# Patient Record
Sex: Female | Born: 1970
Health system: Southern US, Community
[De-identification: ages and names within clinical notes are randomized; demographics above are authoritative.]

## PROBLEM LIST (undated history)

## (undated) DIAGNOSIS — F32A Depression, unspecified: Secondary | ICD-10-CM

## (undated) DIAGNOSIS — E559 Vitamin D deficiency, unspecified: Secondary | ICD-10-CM

## (undated) DIAGNOSIS — F329 Major depressive disorder, single episode, unspecified: Secondary | ICD-10-CM

## (undated) DIAGNOSIS — E669 Obesity, unspecified: Secondary | ICD-10-CM

## (undated) DIAGNOSIS — F419 Anxiety disorder, unspecified: Secondary | ICD-10-CM

## (undated) HISTORY — DX: Obesity, unspecified: E66.9

## (undated) HISTORY — DX: Anxiety disorder, unspecified: F41.9

## (undated) HISTORY — DX: Vitamin D deficiency, unspecified: E55.9

## (undated) HISTORY — DX: Depression, unspecified: F32.A

## (undated) HISTORY — DX: Major depressive disorder, single episode, unspecified: F32.9

---

## 1998-03-16 ENCOUNTER — Other Ambulatory Visit: Admission: RE | Admit: 1998-03-16 | Discharge: 1998-03-16 | Payer: Self-pay | Admitting: Obstetrics and Gynecology

## 1999-03-24 ENCOUNTER — Other Ambulatory Visit: Admission: RE | Admit: 1999-03-24 | Discharge: 1999-03-24 | Payer: Self-pay | Admitting: Obstetrics and Gynecology

## 2000-06-12 ENCOUNTER — Other Ambulatory Visit: Admission: RE | Admit: 2000-06-12 | Discharge: 2000-06-12 | Payer: Self-pay | Admitting: Obstetrics and Gynecology

## 2001-03-19 ENCOUNTER — Inpatient Hospital Stay (HOSPITAL_COMMUNITY): Admission: AD | Admit: 2001-03-19 | Discharge: 2001-03-23 | Payer: Self-pay | Admitting: Obstetrics and Gynecology

## 2001-05-01 ENCOUNTER — Other Ambulatory Visit: Admission: RE | Admit: 2001-05-01 | Discharge: 2001-05-01 | Payer: Self-pay | Admitting: Obstetrics and Gynecology

## 2002-06-03 ENCOUNTER — Other Ambulatory Visit: Admission: RE | Admit: 2002-06-03 | Discharge: 2002-06-03 | Payer: Self-pay | Admitting: Obstetrics and Gynecology

## 2003-07-09 ENCOUNTER — Other Ambulatory Visit: Admission: RE | Admit: 2003-07-09 | Discharge: 2003-07-09 | Payer: Self-pay | Admitting: Obstetrics and Gynecology

## 2003-09-04 ENCOUNTER — Ambulatory Visit (HOSPITAL_COMMUNITY): Admission: RE | Admit: 2003-09-04 | Discharge: 2003-09-04 | Payer: Self-pay | Admitting: Obstetrics and Gynecology

## 2003-10-18 HISTORY — PX: HYSTEROSCOPY: SHX211

## 2004-03-31 ENCOUNTER — Other Ambulatory Visit: Admission: RE | Admit: 2004-03-31 | Discharge: 2004-03-31 | Payer: Self-pay | Admitting: Obstetrics and Gynecology

## 2004-09-07 ENCOUNTER — Inpatient Hospital Stay (HOSPITAL_COMMUNITY): Admission: AD | Admit: 2004-09-07 | Discharge: 2004-09-07 | Payer: Self-pay | Admitting: Obstetrics and Gynecology

## 2004-11-09 ENCOUNTER — Other Ambulatory Visit: Admission: RE | Admit: 2004-11-09 | Discharge: 2004-11-09 | Payer: Self-pay | Admitting: Obstetrics and Gynecology

## 2005-12-08 ENCOUNTER — Other Ambulatory Visit: Admission: RE | Admit: 2005-12-08 | Discharge: 2005-12-08 | Payer: Self-pay | Admitting: Obstetrics and Gynecology

## 2012-04-18 ENCOUNTER — Other Ambulatory Visit: Payer: Self-pay | Admitting: Obstetrics and Gynecology

## 2013-09-10 ENCOUNTER — Other Ambulatory Visit: Payer: Self-pay | Admitting: Obstetrics and Gynecology

## 2014-06-07 ENCOUNTER — Ambulatory Visit: Payer: Self-pay | Admitting: Physician Assistant

## 2014-06-07 ENCOUNTER — Ambulatory Visit: Payer: Self-pay | Admitting: Family Medicine

## 2014-06-07 LAB — COMPREHENSIVE METABOLIC PANEL
ALBUMIN: 3.8 g/dL (ref 3.4–5.0)
ANION GAP: 7 (ref 7–16)
Alkaline Phosphatase: 98 U/L
BILIRUBIN TOTAL: 1 mg/dL (ref 0.2–1.0)
BUN: 6 mg/dL — AB (ref 7–18)
CREATININE: 0.72 mg/dL (ref 0.60–1.30)
Calcium, Total: 8.9 mg/dL (ref 8.5–10.1)
Chloride: 103 mmol/L (ref 98–107)
Co2: 27 mmol/L (ref 21–32)
EGFR (Non-African Amer.): >60
GLUCOSE: 104 mg/dL — AB (ref 65–99)
OSMOLALITY: 272 (ref 275–301)
POTASSIUM: 3.8 mmol/L (ref 3.5–5.1)
SGOT(AST): 15 U/L (ref 15–37)
SGPT (ALT): 39 U/L
SODIUM: 137 mmol/L (ref 136–145)
TOTAL PROTEIN: 7.7 g/dL (ref 6.4–8.2)

## 2014-06-07 LAB — CBC WITH DIFFERENTIAL/PLATELET
BASOS PCT: 0.6 %
Basophil #: 0 10*3/uL (ref 0.0–0.1)
EOS ABS: 0 10*3/uL (ref 0.0–0.7)
Eosinophil %: 0.2 %
HCT: 43.4 % (ref 35.0–47.0)
HGB: 14.5 g/dL (ref 12.0–16.0)
LYMPHS ABS: 1.2 10*3/uL (ref 1.0–3.6)
LYMPHS PCT: 17.3 %
MCH: 30.3 pg (ref 26.0–34.0)
MCHC: 33.5 g/dL (ref 32.0–36.0)
MCV: 91 fL (ref 80–100)
Monocyte #: 0.6 x10 3/mm (ref 0.2–0.9)
Monocyte %: 9.1 %
NEUTROS PCT: 72.8 %
Neutrophil #: 5.1 10*3/uL (ref 1.4–6.5)
PLATELETS: 246 10*3/uL (ref 150–440)
RBC: 4.79 10*6/uL (ref 3.80–5.20)
RDW: 13.6 % (ref 11.5–14.5)
WBC: 7 10*3/uL (ref 3.6–11.0)

## 2014-06-07 LAB — URINALYSIS, COMPLETE
BACTERIA: NEGATIVE
BILIRUBIN, UR: NEGATIVE
Blood: NEGATIVE
Glucose,UR: NEGATIVE
LEUKOCYTE ESTERASE: NEGATIVE
Nitrite: NEGATIVE
Ph: 6 (ref 5.0–8.0)
Protein: NEGATIVE
Specific Gravity: 1.02 (ref 1.000–1.030)
WBC UR: NONE SEEN /HPF (ref 0–5)

## 2014-06-07 LAB — PREGNANCY, URINE: Pregnancy Test, Urine: NEGATIVE m[IU]/mL

## 2014-06-07 LAB — LIPASE, BLOOD: Lipase: 139 U/L (ref 73–393)

## 2014-06-07 LAB — AMYLASE: Amylase: 44 U/L (ref 25–115)

## 2015-04-02 ENCOUNTER — Encounter: Payer: Self-pay | Admitting: *Deleted

## 2015-04-06 ENCOUNTER — Ambulatory Visit (INDEPENDENT_AMBULATORY_CARE_PROVIDER_SITE_OTHER): Payer: No Typology Code available for payment source | Admitting: *Deleted

## 2015-04-06 VITALS — BP 139/92 | HR 75 | Ht 65.0 in | Wt 213.0 lb

## 2015-04-06 DIAGNOSIS — E669 Obesity, unspecified: Secondary | ICD-10-CM

## 2015-04-06 MED ORDER — CYANOCOBALAMIN 1000 MCG/ML IJ SOLN
1000.0000 ug | Freq: Once | INTRAMUSCULAR | Status: AC
  Administered 2015-04-06: 1000 ug via INTRAMUSCULAR

## 2015-05-04 ENCOUNTER — Ambulatory Visit (INDEPENDENT_AMBULATORY_CARE_PROVIDER_SITE_OTHER): Payer: No Typology Code available for payment source | Admitting: Obstetrics and Gynecology

## 2015-05-04 VITALS — BP 135/90 | HR 84 | Ht 65.0 in | Wt 211.7 lb

## 2015-05-04 DIAGNOSIS — E669 Obesity, unspecified: Secondary | ICD-10-CM | POA: Diagnosis not present

## 2015-05-04 MED ORDER — CYANOCOBALAMIN 1000 MCG/ML IJ SOLN
1000.0000 ug | Freq: Once | INTRAMUSCULAR | Status: AC
  Administered 2015-05-04: 1000 ug via INTRAMUSCULAR

## 2015-05-04 NOTE — Progress Notes (Cosign Needed)
Pt is here for wt, bp check, b-12 inj Denies any s/e, feels like she is losing inches, is going to try to start exercising

## 2015-05-06 ENCOUNTER — Other Ambulatory Visit: Payer: Self-pay | Admitting: Obstetrics and Gynecology

## 2015-07-06 ENCOUNTER — Other Ambulatory Visit: Payer: Self-pay | Admitting: Obstetrics and Gynecology

## 2015-07-16 ENCOUNTER — Encounter (INDEPENDENT_AMBULATORY_CARE_PROVIDER_SITE_OTHER): Payer: Self-pay

## 2015-07-16 ENCOUNTER — Encounter: Payer: Self-pay | Admitting: Primary Care

## 2015-07-16 ENCOUNTER — Ambulatory Visit (INDEPENDENT_AMBULATORY_CARE_PROVIDER_SITE_OTHER): Payer: No Typology Code available for payment source | Admitting: Primary Care

## 2015-07-16 VITALS — BP 126/72 | HR 80 | Temp 97.6°F | Ht 65.0 in | Wt 218.8 lb

## 2015-07-16 DIAGNOSIS — E669 Obesity, unspecified: Secondary | ICD-10-CM

## 2015-07-16 DIAGNOSIS — F411 Generalized anxiety disorder: Secondary | ICD-10-CM | POA: Diagnosis not present

## 2015-07-16 DIAGNOSIS — E559 Vitamin D deficiency, unspecified: Secondary | ICD-10-CM | POA: Insufficient documentation

## 2015-07-16 DIAGNOSIS — R21 Rash and other nonspecific skin eruption: Secondary | ICD-10-CM

## 2015-07-16 LAB — VITAMIN D 25 HYDROXY (VIT D DEFICIENCY, FRACTURES): VITD: 54.56 ng/mL (ref 30.00–100.00)

## 2015-07-16 MED ORDER — ESCITALOPRAM OXALATE 20 MG PO TABS: 20.0000 mg | ORAL_TABLET | Freq: Every day | ORAL | Status: DC

## 2015-07-16 MED ORDER — TRIAMCINOLONE ACETONIDE 0.1 % EX CREA: 1.0000 "application " | TOPICAL_CREAM | Freq: Two times a day (BID) | CUTANEOUS | Status: DC

## 2015-07-16 NOTE — Patient Instructions (Addendum)
Start Lexapro 20 mg tablets for anxiety. You may double up on the 10 mg tablets until complete. I sent the 20 mg to your pharmacy.  Complete lab work prior to leaving today. I will notify you of your results.  Stop by the front and speak with Shirlee Limerick regarding your referral to the nutritionist.  Apply triamcinolone cream to rash twice daily. Do not apply to face or groin.  Follow up in 4-6 weeks for re-evaluation of anxiety.  It was a pleasure to meet you today! Please don't hesitate to call me with any questions. Welcome to [facility]!

## 2015-07-16 NOTE — Assessment & Plan Note (Signed)
Present to upper extremities. Evaluated by dermatology and is unsure of the cause. Will send RX for triamcinolone cream to use BID. If continues, will send back to derm.

## 2015-07-16 NOTE — Progress Notes (Signed)
Subjective:    Patient ID: Kayla Hanna, female    DOB: August 15, 1971, 44 y.o.   MRN: [id number]  HPI  [name] is a 44 year old female who presents today to establish care and discuss the problems mentioned below. Will obtain old records.  1) Generalized Anxiety Disorder: Diagnosed in August 2015. She had lost 20 pounds from not eating for several weeks at a time due to anxiety. She was placed on Lexapro 20 mg initially. She is currently managed on Lexapro 10 mg. She was reduced to 10 mg due concerns of weight gain in May 2016. Since the reduction of the medication she was feeling fine for several months but has been under a lot of stress with family issues and no longer feels well managed. She felt better on the 20 mg dose.   2) Skin Bumps: Present since July and located to bilateral upper extremities. She was evaluated by a dermatologist who prescribed a "special moose". She doesn't feel as though it's working well. Her sores will start out as a blister then progress to itching, and scabbing.  3) Vitamin D deficiency: Currently managed on vitamin D 50,000 unit capsules since May 2016. She has not had follow up of levels since.    4) Obesity: Once managed on phentermine 37.5 mg tablets starting in May 2016. She lost 12 pounds. Last dose was in July. She has been maintaining her weight loss, but is frustrated with her lack of weight loss despite a "healthy diet".  Her diet currently consists of: Breakfast: Oatmeal or protein bar Lunch: Salad, grilled chicken, sandwich, take out. Dinner: Eating out (greek food, moes, fast food). Home prepared meals with steak, chicken pie, roast, grilled chicken.  Snacks: None Beverages: Coffee, sweet tea with splenda or truvia and sugar. She does not drink much water.  Exercise: She is currently not exercising as she does not have time in her schedule.   Review of Systems  HENT: Negative for rhinorrhea.   Respiratory: Negative for cough and  shortness of breath.   Cardiovascular: Negative for chest pain.  Gastrointestinal: Negative for diarrhea and constipation.  Genitourinary: Negative for difficulty urinating.       No periods with IUD  Musculoskeletal: Negative for myalgias and arthralgias.  Skin: Positive for rash.  Neurological: Negative for dizziness, numbness and headaches.  Psychiatric/Behavioral:       See HPI.       Past Medical History  Diagnosis Date  . Obesity   . Anxiety   . Depression   . Vitamin D deficiency     Social History   Social History  . Marital Status: Married    Spouse Name: N/A  . Number of Children: N/A  . Years of Education: N/A   Occupational History  . Not on file.   Social History Main Topics  . Smoking status: Never Smoker   . Smokeless tobacco: Never Used  . Alcohol Use: Yes     Comment: occas  . Drug Use: No  . Sexual Activity: Yes    Birth Control/ Protection: IUD   Other Topics Concern  . Not on file   Social History Narrative   Married.   2 children.   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   Enjoys shopping, reading, exercising.     Past Surgical History  Procedure Laterality Date  . Hysteroscopy  2005    Family History  Problem Relation Age of Onset  . Heart disease Paternal Grandmother   .  Diabetes Paternal Grandmother     No Known Allergies  Current Outpatient Prescriptions on File Prior to Visit  Medication Sig Dispense Refill  . levonorgestrel (MIRENA) 20 MCG/24HR IUD 1 each by Intrauterine route once.    . phentermine (ADIPEX-P) 37.5 MG tablet Take 37.5 mg by mouth daily before breakfast.    . vitamin B-12 (CYANOCOBALAMIN) 1000 MCG tablet Inject 1,000 mcg into the muscle every 30 (thirty) days.    . [DISCONTINUED] escitalopram (LEXAPRO) 10 MG tablet TAKE ONE TABLET BY MOUTH EVERY DAY 30 tablet 2  . ergocalciferol (VITAMIN D2) 50000 UNITS capsule Take 50,000 Units by mouth once a week.     No current facility-administered medications on  file prior to visit.    BP 126/72 mmHg  Pulse 80  Temp(Src) 97.6 F (36.4 C) (Oral)  Ht  (1.651 m)  Wt 218 lb 12.8 oz (99.247 kg)  BMI 36.41 kg/m2  SpO2 98%    Objective:   Physical Exam  Constitutional: She is oriented to person, place, and time. She appears well-nourished.  HENT:  Head: Normocephalic.  Neck: Neck supple.  Cardiovascular: Normal rate and regular rhythm.   Pulmonary/Chest: Effort normal and breath sounds normal.  Neurological: She is alert and oriented to person, place, and time.  Skin: Skin is warm and dry. Rash noted.  Multiple bumps noted to right upper extremity in various stages of healing.  Psychiatric: She has a normal mood and affect.          Assessment & Plan:

## 2015-07-16 NOTE — Assessment & Plan Note (Signed)
Diagnosed in August 2015. Currently managed on Lexapro 10 mg, doesn't feel well managed at this dose due to increased stress with family. Felt better at 20 mg dose prior to decrease in May 2016. Dose increased to 20 mg. Denies SI/HI. Follow up in 4-6 for re-evaluation.

## 2015-07-16 NOTE — Assessment & Plan Note (Signed)
Once managed on phentermine 37.5 mg and lost 12 pounds. Diet is fair, but room for improvement and does not exercise. Discussed healthy diet. Will send to nutritionist for assistance with weight loss efforts.

## 2015-07-16 NOTE — Progress Notes (Signed)
Pre visit review using our clinic review tool, if applicable. No additional management support is needed unless otherwise documented below in the visit note. 

## 2015-07-16 NOTE — Assessment & Plan Note (Signed)
Currently managed on 50,000 unit capsules since May 2016. No recent labs. Vitamin D level today and is pending. Will adjust accordingly if necessary.

## 2015-08-21 ENCOUNTER — Ambulatory Visit: Payer: No Typology Code available for payment source | Admitting: Primary Care

## 2015-11-03 ENCOUNTER — Other Ambulatory Visit: Payer: Self-pay | Admitting: Primary Care

## 2015-11-03 NOTE — Telephone Encounter (Signed)
Called patient regarding the increased dosage. Patient stated that the medication helps but she noticing the weight gain. She is concern about the weight gain.

## 2015-11-03 NOTE — Telephone Encounter (Signed)
Electronically refill request for   escitalopram (LEXAPRO) 20 MG tablet   Take 1 tablet (20 mg total) by mouth daily.  Dispense: 30 tablet   Refills: 3     Last prescribed on 07/16/2015. Last seen on 07/16/2015. No future appt.

## 2015-12-07 ENCOUNTER — Other Ambulatory Visit: Payer: Self-pay | Admitting: Primary Care

## 2015-12-07 DIAGNOSIS — F411 Generalized anxiety disorder: Secondary | ICD-10-CM

## 2015-12-07 NOTE — Telephone Encounter (Signed)
Electronically refill request for   escitalopram (LEXAPRO) 20 MG tablet   Take 1 tablet (20 mg total) by mouth daily.  Dispense: 30 tablet   Refills: 3     Last prescribed on 07/16/2015. Last seen on 07/13/2015. No future appointment.

## 2015-12-08 NOTE — Telephone Encounter (Signed)
Message left for patient to return my call on 12/07/2015 and 12/08/2015.

## 2016-02-03 ENCOUNTER — Other Ambulatory Visit: Payer: Self-pay | Admitting: Obstetrics and Gynecology

## 2016-02-03 DIAGNOSIS — R928 Other abnormal and inconclusive findings on diagnostic imaging of breast: Secondary | ICD-10-CM

## 2016-02-09 ENCOUNTER — Ambulatory Visit
Admission: RE | Admit: 2016-02-09 | Discharge: 2016-02-09 | Disposition: A | Discharge status: Home / Self Care | Payer: Managed Care, Other (non HMO) | Source: Ambulatory Visit | Attending: Obstetrics and Gynecology | Admitting: Obstetrics and Gynecology

## 2016-02-09 DIAGNOSIS — R928 Other abnormal and inconclusive findings on diagnostic imaging of breast: Secondary | ICD-10-CM

## 2016-08-23 ENCOUNTER — Other Ambulatory Visit: Payer: Self-pay | Admitting: Obstetrics and Gynecology

## 2016-08-23 DIAGNOSIS — Z1231 Encounter for screening mammogram for malignant neoplasm of breast: Secondary | ICD-10-CM

## 2016-08-23 DIAGNOSIS — N6310 Unspecified lump in the right breast, unspecified quadrant: Secondary | ICD-10-CM

## 2016-09-01 ENCOUNTER — Ambulatory Visit
Admission: RE | Admit: 2016-09-01 | Discharge: 2016-09-01 | Disposition: A | Discharge status: Home / Self Care | Payer: Managed Care, Other (non HMO) | Source: Ambulatory Visit | Attending: Obstetrics and Gynecology | Admitting: Obstetrics and Gynecology

## 2016-09-01 DIAGNOSIS — N6310 Unspecified lump in the right breast, unspecified quadrant: Secondary | ICD-10-CM

## 2017-04-12 ENCOUNTER — Other Ambulatory Visit: Payer: Self-pay | Admitting: Obstetrics and Gynecology

## 2017-04-12 DIAGNOSIS — N63 Unspecified lump in unspecified breast: Secondary | ICD-10-CM

## 2017-04-18 ENCOUNTER — Ambulatory Visit
Admission: RE | Admit: 2017-04-18 | Discharge: 2017-04-18 | Disposition: A | Discharge status: Home / Self Care | Payer: 59 | Source: Ambulatory Visit | Attending: Obstetrics and Gynecology | Admitting: Obstetrics and Gynecology

## 2017-04-18 DIAGNOSIS — N63 Unspecified lump in unspecified breast: Secondary | ICD-10-CM

## 2017-11-30 ENCOUNTER — Encounter: Payer: Self-pay | Admitting: Nurse Practitioner

## 2017-11-30 ENCOUNTER — Ambulatory Visit (INDEPENDENT_AMBULATORY_CARE_PROVIDER_SITE_OTHER): Payer: 59 | Admitting: Nurse Practitioner

## 2017-11-30 VITALS — BP 134/78 | HR 87 | Temp 98.5°F | Resp 18 | Ht 65.0 in | Wt 201.6 lb

## 2017-11-30 DIAGNOSIS — J209 Acute bronchitis, unspecified: Secondary | ICD-10-CM

## 2017-11-30 MED ORDER — UMECLIDINIUM BROMIDE 62.5 MCG/INH IN AEPB: 1.0000 | INHALATION_SPRAY | Freq: Every day | RESPIRATORY_TRACT | 1 refills | Status: DC

## 2017-11-30 NOTE — Patient Instructions (Addendum)
Idalia Needleaige, Thank you for coming in to clinic today.  1. It sounds like you have a Viral bronchitis - this will most likely run it's course in 10 days, with cough lasting 3 weeks -3 months. Recommend good hand washing. -If congestion is worse, start OTC Mucinex (or may try Mucinex-DM for cough) up to 7-10 days then stop - Drink plenty of fluids to improve congestion - Continue tessalon perles 100 mg three times daily as needed for cough - You may try over the counter Nasal Saline spray (Simply Saline, Ocean Spray) as needed to reduce congestion. - Drink warm herbal tea with honey for sore throat. - Start taking Tylenol extra strength 1 to 2 tablets every 6-8 hours for aches or fever/chills for next few days as needed.  Do not take more than 3,000 mg in 24 hours from all medicines.  May take Ibuprofen as well if tolerated 200-400mg  every 8 hours as needed.  If symptoms significantly worsening with persistent fevers/chills despite tylenol/ibpurofen, nausea, vomiting unable to tolerate food/fluids or medicine, body aches, or shortness of breath, sinus pain pressure or worsening productive cough, then follow-up for re-evaluation, may seek more immediate care at Urgent Care or ED if more concerned for emergency.   Please schedule a follow-up appointment with Wilhelmina McardleLauren Tomisha Reppucci, AGNP. Return 7-10 days if symptoms worsen or fail to improve.  If you have any other questions or concerns, please feel free to call the clinic or send a message through MyChart. You may also schedule an earlier appointment if necessary.  You will receive a survey after today's visit either digitally by e-mail or paper by [employer] mail. Your experiences and feedback matter to us.  Please respond so we know how we are doing as we provide care for you.   Wilhelmina McardleLauren Rikki Trosper, DNP, AGNP-BC Adult Gerontology Nurse Practitioner [facility], [facility]

## 2017-11-30 NOTE — Progress Notes (Signed)
Subjective:    Patient ID: Kayla Hanna, female    DOB: 05-28-1971, 47 y.o.   MRN: [id number]  Kayla Hanna is a 47 y.o. female presenting on 11/30/2017 for Cough (cough, chest congestion, w/ difficulty breathing x 2 weeks )   HPI Cough Has had cough and congestoin x 2 weeks.  Went to Urgent Care Friday night.  There, she received a breathing treatment in office, and prescriptions for z-pack, inhaler and cough medicine.  - Cough med is helping. Congestion and cough are not improving significantly, however. Chest feels heavier, and at times like there is an elephant on chest.  Denies burning chest pain and pressure, but states it is more like tightness with deep breath. - Albuterol is making her jittery and is trying not to use it.  Social History   Tobacco Use  . Smoking status: Never Smoker  . Smokeless tobacco: Never Used  Substance Use Topics  . Alcohol use: Yes    Comment: occas  . Drug use: No    Review of Systems Per HPI unless specifically indicated above     Objective:    BP 134/78 (BP Location: Right Arm, Patient Position: Sitting, Cuff Size: Normal)   Pulse 87   Temp 98.5 F (36.9 C) (Oral)   Resp 18   Ht 5\' 5"  (1.651 m)   Wt 201 lb 9.6 oz (91.4 kg)   SpO2 99%   BMI 33.55 kg/m   Wt Readings from Last 3 Encounters:  11/30/17 201 lb 9.6 oz (91.4 kg)  07/16/15 218 lb 12.8 oz (99.2 kg)  05/04/15 211 lb 11.2 oz (96 kg)    Physical Exam  General - obese, well-appearing, NAD HEENT - Normocephalic, atraumatic, PERRL, EOMI, patent nares w/ mild congestion, oropharynx clear, MMM, TM normal, Ear canal normal, external ear normal w/o lesions, mild sinus tenderness Neck - supple, non-tender, cervical LAD Heart - RRR, no murmurs heard Lungs - Wheezing and fine crackles throughout all lobes, no rhonchi. Normal work of breathing. Extremeties - non-tender, no edema, cap refill < 2 seconds, peripheral pulses intact +2 bilaterally Skin - warm, dry, no  rashes Neuro - awake, alert, oriented x3, normal gait Psych - Normal mood and affect, normal behavior     Results for orders placed or performed in visit on 07/16/15  Vit D  25 hydroxy (rtn osteoporosis monitoring)  Result Value Ref Range   VITD 54.56 30.00 - 100.00 ng/mL      Assessment & Plan:   Problem List Items Addressed This Visit    None    Visit Diagnoses    Acute bronchitis, unspecified organism    -  Primary   Relevant Medications   umeclidinium bromide (INCRUSE ELLIPTA) 62.5 MCG/INH AEPB    Acute illness. Fever responsive to NSAIDs and tylenol.  Symptoms not worsening. Consistent with viral illness and bronchitis x 7 days with no known sick contacts and no identifiable focal infections of ears, nose, throat.  Plan: 1. Reassurance, likely self-limited with cough lasting up to few weeks to 3 months - Start Mucinex-DM OTC up to 7-10 days then stop - Continue tessalon perles 100 mg every 12 hours as needed for cough. - START incruse inhaler for long-term coverage for next 1-2 months. - Continue albuterol as needed. 2. Supportive care with nasal saline, warm herbal tea with honey, 3. Improve hydration 4. Tylenol / Motrin PRN fevers 5. Return criteria given 7-10 days as needed.    Meds ordered this encounter  Medications  . umeclidinium bromide (INCRUSE ELLIPTA) 62.5 MCG/INH AEPB    Sig: Inhale 1 puff into the lungs daily.    Dispense:  30 each    Refill:  1    Order Specific Question:   Supervising Provider    Answer:   Smitty CordsKARAMALEGOS, ALEXANDER J [[id number]]    Follow up plan: Return 7-10 days if symptoms worsen or fail to improve.  Wilhelmina McardleLauren Emmajo Bennette, DNP, AGPCNP-BC Adult Gerontology Primary Care Nurse Practitioner [facility] 12/14/2017, 4:58 PM

## 2017-12-14 ENCOUNTER — Encounter: Payer: Self-pay | Admitting: Nurse Practitioner

## 2018-03-18 DIAGNOSIS — S93602A Unspecified sprain of left foot, initial encounter: Secondary | ICD-10-CM | POA: Insufficient documentation

## 2018-04-04 ENCOUNTER — Ambulatory Visit (INDEPENDENT_AMBULATORY_CARE_PROVIDER_SITE_OTHER): Payer: 59

## 2018-04-04 ENCOUNTER — Ambulatory Visit: Payer: 59 | Admitting: Podiatry

## 2018-04-04 ENCOUNTER — Encounter: Payer: Self-pay | Admitting: Podiatry

## 2018-04-04 VITALS — BP 135/88 | HR 97 | Resp 16

## 2018-04-04 DIAGNOSIS — S93602A Unspecified sprain of left foot, initial encounter: Secondary | ICD-10-CM

## 2018-04-04 NOTE — Progress Notes (Signed)
  Subjective:  Patient ID: Kayla Hanna, female    DOB: 02-25-1971,  MRN: [id number] HPI Chief Complaint  Patient presents with  . Foot Injury    Lateral ankle/foot and arch left - rolled ankle x 2 weeks ago, initially couldn't bear weight, swollen and bruised in arch, had xrays done at time of injury said no fractures, wearing boot, still swollen, pain is some better  . New Patient (Initial Visit)    47 y.o. female presents with the above complaint.    ROS: Denies fever chills nausea vomiting muscle aches and pains.  Denies calf pain back pain chest pain shortness of breath and headache.  Past Medical History:  Diagnosis Date  . Anxiety   . Depression   . Obesity   . Vitamin D deficiency    Past Surgical History:  Procedure Laterality Date  . HYSTEROSCOPY  2005    Current Outpatient Medications:  .  buPROPion (WELLBUTRIN XL) 300 MG 24 hr tablet, , Disp: , Rfl:  .  levonorgestrel (MIRENA) 20 MCG/24HR IUD, 1 each by Intrauterine route once., Disp: , Rfl:  .  phentermine (ADIPEX-P) 37.5 MG tablet, Take 37.5 mg by mouth daily before breakfast., Disp: , Rfl:   No Known Allergies Review of Systems Objective:   Vitals:   04/04/18 1544  BP: 135/88  Pulse: 97  Resp: 16    General: Well developed, nourished, in no acute distress, alert and oriented x3   Dermatological: Skin is warm, dry and supple bilateral. Nails x 10 are well maintained; remaining integument appears unremarkable at this time. There are no open sores, no preulcerative lesions, no rash or signs of infection present.  Vascular: Dorsalis Pedis artery and Posterior Tibial artery pedal pulses are 2/4 bilateral with immedate capillary fill time. Pedal hair growth present. No varicosities and no lower extremity edema present bilateral.   Neruologic: Grossly intact via light touch bilateral. Vibratory intact via tuning fork bilateral. Protective threshold with Semmes Wienstein monofilament intact to all pedal  sites bilateral. Patellar and Achilles deep tendon reflexes 2+ bilateral. No Babinski or clonus noted bilateral.   Musculoskeletal: No gross boney pedal deformities bilateral. No pain, crepitus, or limitation noted with foot and ankle range of motion bilateral. Muscular strength 5/5 in all groups tested bilateral.  Pain and swelling with mild ecchymosis overlying the fourth fifth tarsometatarsal joint.  Mild tenderness overlying the anterior talofibular ligament.  Gait: Unassisted, Nonantalgic.    Radiographs:  Radiographs taken today do not demonstrate any major osseous abnormalities no acute findings.  No fractures are visualized.  Assessment & Plan:   Assessment: Assessment is strain or sprain of the fourth fifth tarsometatarsal joint  Plan: Compression anklet for the swelling and her in a Cam walker.  Follow-up with her in 3 to 4 weeks for another set of x-rays.     Maddy Graham T. [place], [place]PM

## 2018-04-17 ENCOUNTER — Other Ambulatory Visit: Payer: Self-pay | Admitting: Obstetrics and Gynecology

## 2018-04-17 DIAGNOSIS — N6310 Unspecified lump in the right breast, unspecified quadrant: Secondary | ICD-10-CM

## 2018-05-04 ENCOUNTER — Ambulatory Visit
Admission: RE | Admit: 2018-05-04 | Discharge: 2018-05-04 | Disposition: A | Discharge status: Home / Self Care | Payer: 59 | Source: Ambulatory Visit | Attending: Obstetrics and Gynecology | Admitting: Obstetrics and Gynecology

## 2018-05-04 DIAGNOSIS — N6310 Unspecified lump in the right breast, unspecified quadrant: Secondary | ICD-10-CM

## 2018-05-07 ENCOUNTER — Ambulatory Visit: Payer: 59 | Admitting: Podiatry

## 2018-05-07 ENCOUNTER — Encounter: Payer: Self-pay | Admitting: Podiatry

## 2018-05-07 ENCOUNTER — Other Ambulatory Visit: Payer: Self-pay | Admitting: Podiatry

## 2018-05-07 ENCOUNTER — Ambulatory Visit (INDEPENDENT_AMBULATORY_CARE_PROVIDER_SITE_OTHER): Payer: 59

## 2018-05-07 DIAGNOSIS — S93602A Unspecified sprain of left foot, initial encounter: Secondary | ICD-10-CM | POA: Diagnosis not present

## 2018-05-07 DIAGNOSIS — S93602D Unspecified sprain of left foot, subsequent encounter: Secondary | ICD-10-CM | POA: Diagnosis not present

## 2018-05-07 NOTE — Progress Notes (Signed)
She presents today for follow-up of her left foot sprain.  She states that he is feeling better but is still has sharp shooting pains around the outside of the foot when she steps down a certain way it feels like is going to give way and is a sharp shooting pain.  Objective: Vital signs are stable alert and oriented x3.  Pulses are palpable.  No reproducible pain on palpation or range of motion of the ankle joint anterior aspect of the ankle joint or subtalar joint.  Radiographs do not demonstrate any new findings.  Assessment: Ankle sprain strain resolving slowly.  Plan: Pacer in a Tri-Lock brace and I will follow-up with [place]

## 2018-06-06 ENCOUNTER — Ambulatory Visit: Payer: 59 | Admitting: Podiatry

## 2018-06-06 ENCOUNTER — Encounter: Payer: Self-pay | Admitting: Podiatry

## 2018-06-06 DIAGNOSIS — S93602D Unspecified sprain of left foot, subsequent encounter: Secondary | ICD-10-CM

## 2018-06-06 NOTE — Progress Notes (Signed)
She presents today for follow-up of sprain to her left foot.  States that is better but it still gets some soreness and swelling across the top.  I wear the brace on and off as I feel like I need to.  Objective: Vital signs are stable alert and oriented x3.  Pulses are palpable.  There is no erythema edema cellulitis drainage or odor there is no reproducible pain.  Assessment: Follow-up with Kayla Hanna on an as-needed basis foot sprain dorsal aspect of the left foot.  Plan: Follow-up as needed.

## 2019-07-01 ENCOUNTER — Other Ambulatory Visit: Payer: Self-pay | Admitting: Obstetrics and Gynecology

## 2019-07-01 DIAGNOSIS — Z1231 Encounter for screening mammogram for malignant neoplasm of breast: Secondary | ICD-10-CM

## 2019-08-08 ENCOUNTER — Ambulatory Visit
Admission: RE | Admit: 2019-08-08 | Discharge: 2019-08-08 | Disposition: A | Discharge status: Home / Self Care | Payer: 59 | Source: Ambulatory Visit | Attending: Obstetrics and Gynecology | Admitting: Obstetrics and Gynecology

## 2019-08-08 ENCOUNTER — Other Ambulatory Visit: Payer: Self-pay

## 2019-08-08 DIAGNOSIS — Z1231 Encounter for screening mammogram for malignant neoplasm of breast: Secondary | ICD-10-CM

## 2020-07-20 ENCOUNTER — Other Ambulatory Visit: Payer: Self-pay | Admitting: Obstetrics and Gynecology

## 2020-07-20 DIAGNOSIS — Z1231 Encounter for screening mammogram for malignant neoplasm of breast: Secondary | ICD-10-CM

## 2020-08-14 ENCOUNTER — Other Ambulatory Visit: Payer: Self-pay

## 2020-08-14 ENCOUNTER — Ambulatory Visit
Admission: RE | Admit: 2020-08-14 | Discharge: 2020-08-14 | Disposition: A | Discharge status: Home / Self Care | Payer: BC Managed Care – PPO | Source: Ambulatory Visit | Attending: Obstetrics and Gynecology | Admitting: Obstetrics and Gynecology

## 2020-08-14 DIAGNOSIS — Z1231 Encounter for screening mammogram for malignant neoplasm of breast: Secondary | ICD-10-CM

## 2020-08-24 ENCOUNTER — Other Ambulatory Visit: Payer: Self-pay | Admitting: Obstetrics and Gynecology

## 2020-08-24 DIAGNOSIS — R928 Other abnormal and inconclusive findings on diagnostic imaging of breast: Secondary | ICD-10-CM

## 2020-08-29 ENCOUNTER — Ambulatory Visit: Payer: BC Managed Care – PPO

## 2020-08-29 ENCOUNTER — Ambulatory Visit
Admission: RE | Admit: 2020-08-29 | Discharge: 2020-08-29 | Disposition: A | Discharge status: Home / Self Care | Payer: BC Managed Care – PPO | Source: Ambulatory Visit | Attending: Obstetrics and Gynecology | Admitting: Obstetrics and Gynecology

## 2020-08-29 ENCOUNTER — Other Ambulatory Visit: Payer: Self-pay

## 2020-08-29 DIAGNOSIS — R928 Other abnormal and inconclusive findings on diagnostic imaging of breast: Secondary | ICD-10-CM

## 2021-08-18 ENCOUNTER — Other Ambulatory Visit: Payer: Self-pay | Admitting: Obstetrics and Gynecology

## 2021-08-18 DIAGNOSIS — Z1231 Encounter for screening mammogram for malignant neoplasm of breast: Secondary | ICD-10-CM

## 2021-09-20 ENCOUNTER — Ambulatory Visit
Admission: RE | Admit: 2021-09-20 | Discharge: 2021-09-20 | Disposition: A | Discharge status: Home / Self Care | Payer: BC Managed Care – PPO | Source: Ambulatory Visit

## 2021-09-20 ENCOUNTER — Other Ambulatory Visit: Payer: Self-pay

## 2021-09-20 DIAGNOSIS — Z1231 Encounter for screening mammogram for malignant neoplasm of breast: Secondary | ICD-10-CM

## 2022-01-26 ENCOUNTER — Encounter: Payer: Self-pay | Admitting: Podiatry

## 2022-01-26 ENCOUNTER — Ambulatory Visit (INDEPENDENT_AMBULATORY_CARE_PROVIDER_SITE_OTHER): Payer: BC Managed Care – PPO | Admitting: Podiatry

## 2022-01-26 ENCOUNTER — Ambulatory Visit (INDEPENDENT_AMBULATORY_CARE_PROVIDER_SITE_OTHER): Payer: BC Managed Care – PPO

## 2022-01-26 DIAGNOSIS — R600 Localized edema: Secondary | ICD-10-CM

## 2022-01-26 DIAGNOSIS — S93602A Unspecified sprain of left foot, initial encounter: Secondary | ICD-10-CM

## 2022-01-26 NOTE — Progress Notes (Signed)
Subjective:  ? ?Patient ID: Kayla Hanna, female   DOB: 51 y.o.   MRN: [id number]  ? ?HPI ?Patient was seen approximately 3-1/2 years ago by Dr. Al Corpus for chronic sprained left ankle and has developed a severe left ankle sprain 1 week ago was seen [facility] and then is sent over to [place].  Patient does not smoke tries to be active and does sprain her ankle periodically but has not had a severe ankle sprain for a number of years.  She did miss a step and that is why she sprained this ankle ? ? ?Review of Systems  ?All other systems reviewed and are negative. ? ? ?   ?Objective:  ?Physical Exam ?Vitals and nursing note reviewed.  ?Constitutional:   ?   Appearance: She is well-developed.  ?Pulmonary:  ?   Effort: Pulmonary effort is normal.  ?Musculoskeletal:     ?   General: Normal range of motion.  ?Skin: ?   General: Skin is warm.  ?Neurological:  ?   Mental Status: She is alert.  ?  ?Neurovascular status intact muscle strength adequate range of motion adequate with splinting on the left side with edema in the midfoot into the ankle with pain that is mostly around the lateral ankle around the anterior talofibular and calcaneofibular ligaments.  Patient has quite a bit of pain in this area localized and no pain midfoot forefoot ? ?   ?Assessment:  ?Probability for severe ankle sprain after she missed a step left and turned her left ankle ? ?   ?Plan:  ?H&P ankle views reviewed and I went ahead applied boot a boot and Ace wrap to try to reduce swelling along with continued elevation boot usage and immobilization with hopeful gradual weightbearing over the next couple weeks.  She will see Dr. Al Corpus may require long-term MRIs with possibility for ligament stabilization procedure but too early to make that kind of judgment ? ?X-rays indicate that there appears to be no fracture of the ankle with no diastases injury.  A lateral view that she showed me from previous time she was seen for this last week did  not indicate the possibility for a fleck fracture of the navicular but that is now where her pain is and she is immobilized or not concerned about that currently ?   ? ? ?

## 2022-02-18 ENCOUNTER — Encounter: Payer: Self-pay | Admitting: Podiatry

## 2022-02-18 ENCOUNTER — Ambulatory Visit: Payer: BC Managed Care – PPO | Admitting: Podiatry

## 2022-02-18 DIAGNOSIS — S93402D Sprain of unspecified ligament of left ankle, subsequent encounter: Secondary | ICD-10-CM | POA: Diagnosis not present

## 2022-02-18 DIAGNOSIS — M25372 Other instability, left ankle: Secondary | ICD-10-CM

## 2022-02-18 NOTE — Progress Notes (Signed)
? ?  HPI: 51 y.o. female presenting today for follow-up evaluation of a severe ankle sprain to the left ankle.  States that she is feeling much better.  She has been weightbearing in the cam boot as instructed.  She does state that she has a longstanding history of chronic recurrent ankle sprains ever since high school.  She is concerned for chronic ankle instability and ligament ruptures.  She presents for further treatment and evaluation ? ?Past Medical History:  ?Diagnosis Date  ? Anxiety   ? Depression   ? Obesity   ? Vitamin D deficiency   ? ? ?Past Surgical History:  ?Procedure Laterality Date  ? HYSTEROSCOPY  2005  ? ? ?No Known Allergies ?  ?Physical Exam: ?General: The patient is alert and oriented x3 in no acute distress. ? ?Dermatology: Skin is warm, dry and supple bilateral lower extremities. Negative for open lesions or macerations. ? ?Vascular: Palpable pedal pulses bilaterally. Capillary refill within normal limits.  There is some mild to moderate edema noted lateral aspect of the left ankle.  No ecchymosis. ? ?Neurological: Light touch and protective threshold grossly intact ? ?Musculoskeletal Exam: No pedal deformities noted.  There continues to be some mild edema with tenderness throughout the left ankle joint lateral aspect ? ? ?Assessment: ?1.  Acute ankle sprain left ?2.  Chronic ankle instability left ? ? ?Plan of Care:  ?1. Patient evaluated.  ?2.  Order placed for physical therapy at [facility] ?3.  MRI ordered LT ankle without contrast.  Patient is concerned due to chronic ankle instability and she would like to have MRI to determine the extent of her ligament damage ?4.  Patient may begin to slowly transition out of the cam boot into good supportive compression ankle sleeve or ankle brace ?5.  Compression ankle sleeves dispensed ?6.  Return to clinic 4 weeks ?  ?Felecia Shelling, DPM ?[facility] ? ?Dr. Felecia Shelling, DPM  ?  ?[address]                                         ?[place], [place] [address]                ?Office [phone]  ?Fax [phone] ? ? ? ? ?

## 2022-03-02 ENCOUNTER — Telehealth: Payer: Self-pay | Admitting: *Deleted

## 2022-03-02 NOTE — Telephone Encounter (Signed)
Patient's MRI (leg joint w/o contrast) has been approved from [place], authorization # [id number]/04/22 ?

## 2022-03-04 ENCOUNTER — Ambulatory Visit
Admission: RE | Admit: 2022-03-04 | Discharge: 2022-03-04 | Disposition: A | Discharge status: Home / Self Care | Payer: BC Managed Care – PPO | Source: Ambulatory Visit | Attending: Podiatry | Admitting: Podiatry

## 2022-03-04 DIAGNOSIS — S93402D Sprain of unspecified ligament of left ankle, subsequent encounter: Secondary | ICD-10-CM

## 2022-03-04 DIAGNOSIS — M25372 Other instability, left ankle: Secondary | ICD-10-CM

## 2022-06-17 IMAGING — MR MR ANKLE*L* W/O CM
4 of 8 series · 14 of 40 positions shown · non-contrast
Comparison: Radiographs dated January 26, 2022 transferred

CLINICAL DATA: Ankle pain, instability suspected. Negative x-ray.
Left ankle injury 6 weeks ago. Missed a step, fracture, placed in a
boot. No surgery.

EXAM:
MRI OF THE LEFT ANKLE WITHOUT CONTRAST
TECHNIQUE: Multiplanar, multisequence MR imaging of the ankle was performed. No
intravenous contrast was administered.

[Series 3: PD fat-sat · axial · left · 3.0mm · 0.25mm/px · z∈[-87,+32]mm · 5 of 31 slices shown]
[im 1/31]
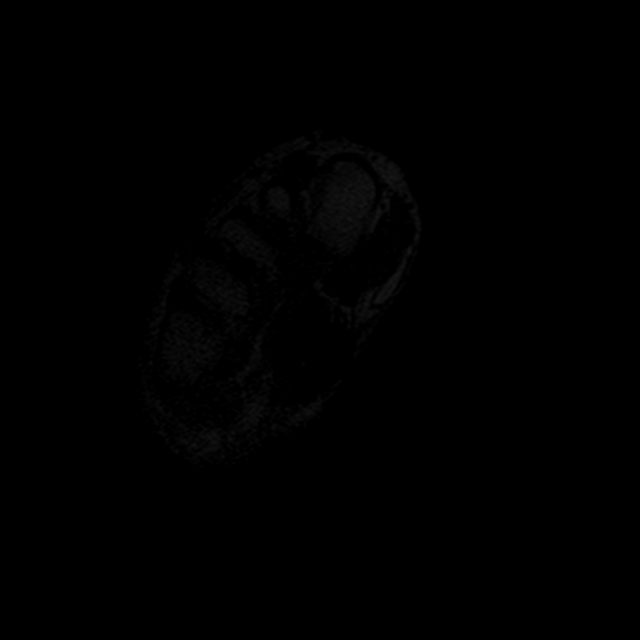
[im 7/31]
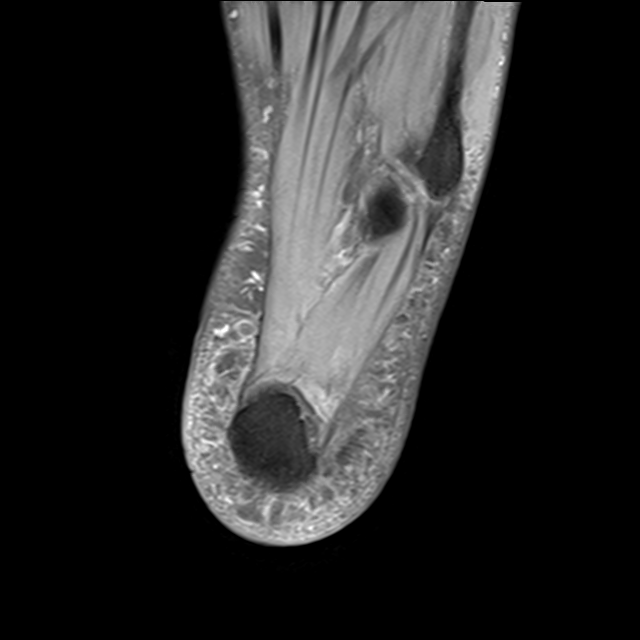
[im 13/31]
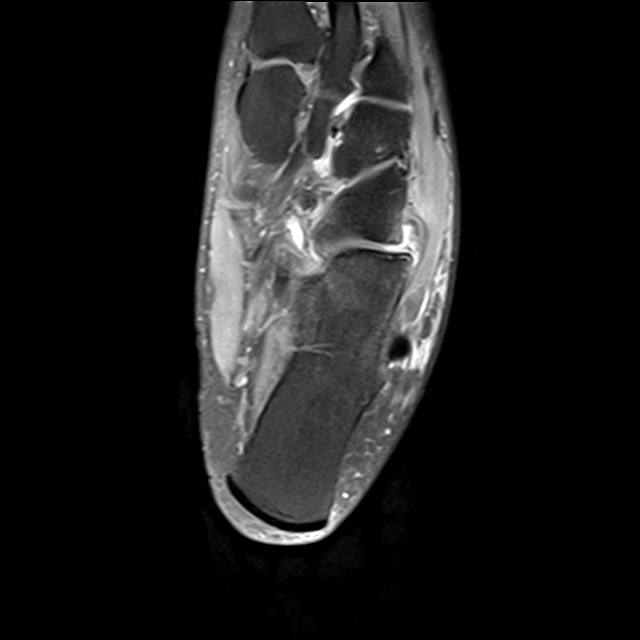
[im 19/31]
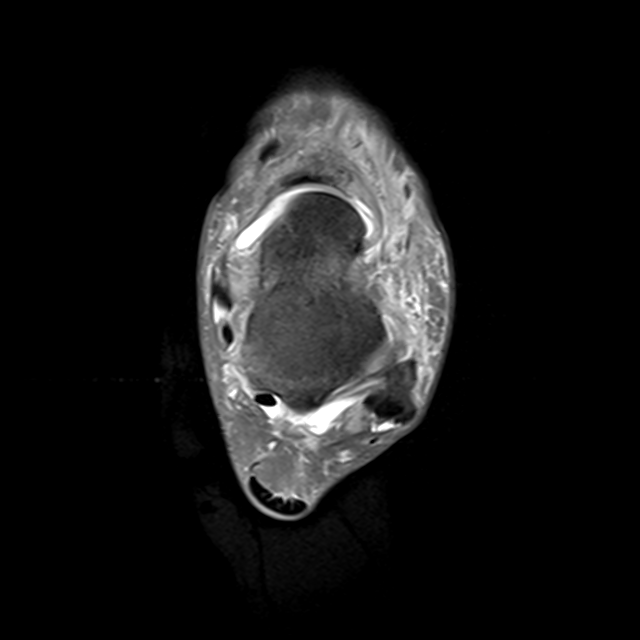
[im 31/31]
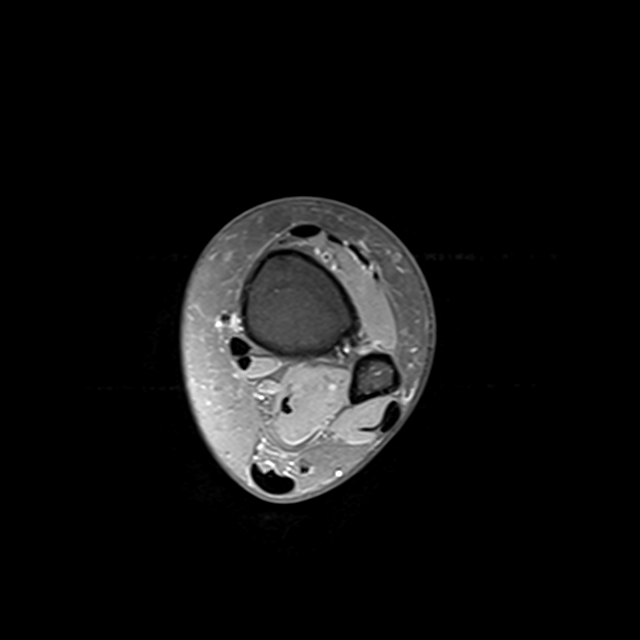

[Series 4: T2 fat-sat · axial · left · 3.0mm · 0.25mm/px · z∈[-63,+32]mm · 3 of 31 slices shown (1 of 3)]
[im 7/31]
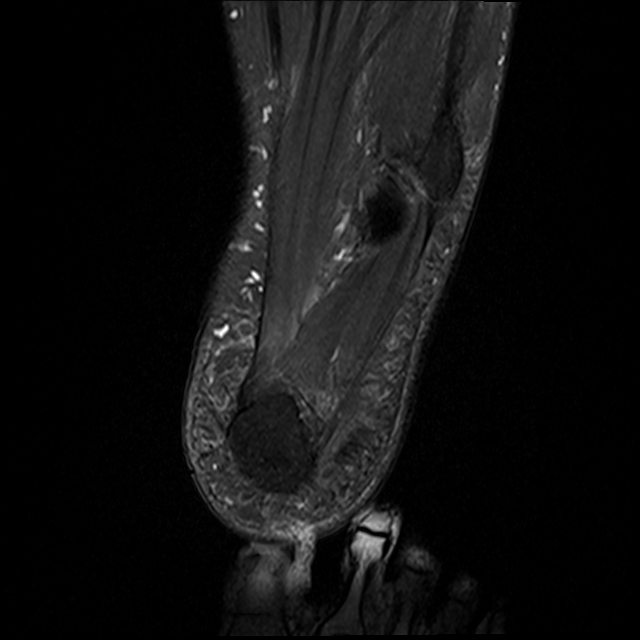
[im 19/31]
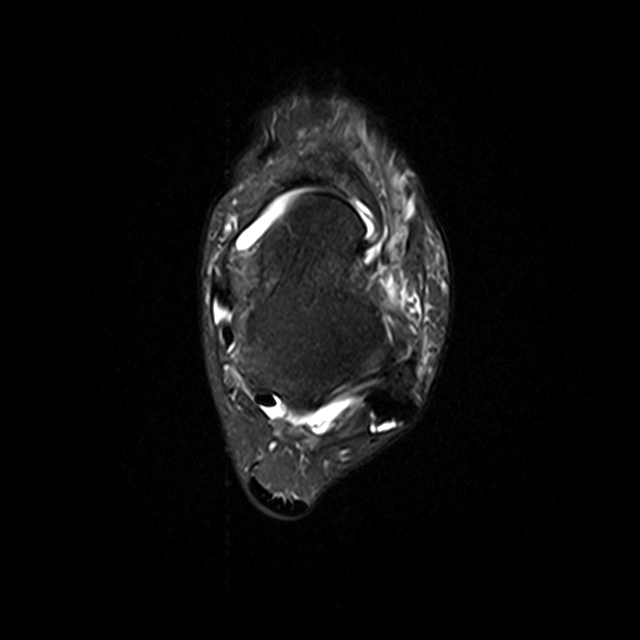
[im 31/31]
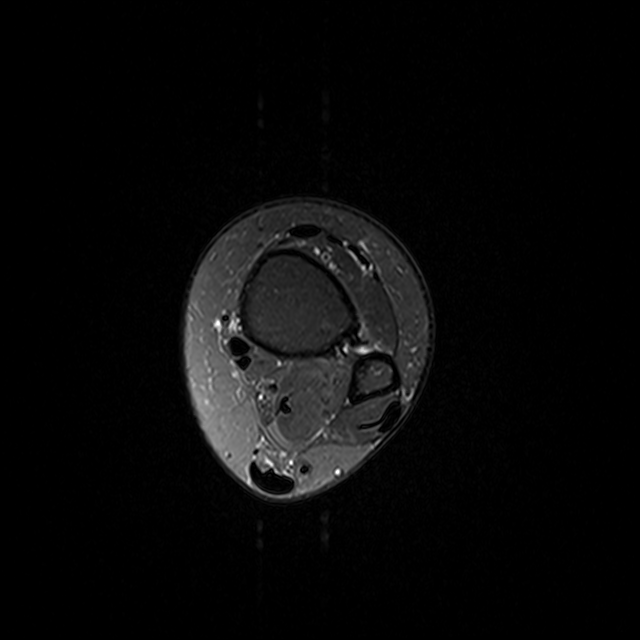

[Series 7: T2 fat-sat · coronal · left · 3.0mm · 0.25mm/px · 3 of 33 slices shown (2 of 3)]
[im 7/33]
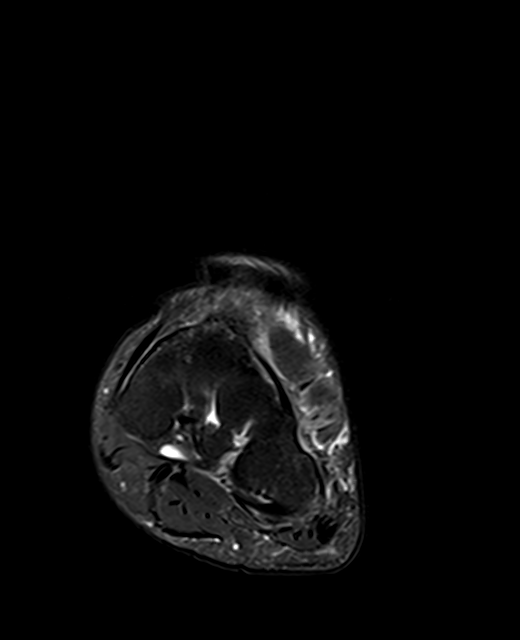
[im 20/33]
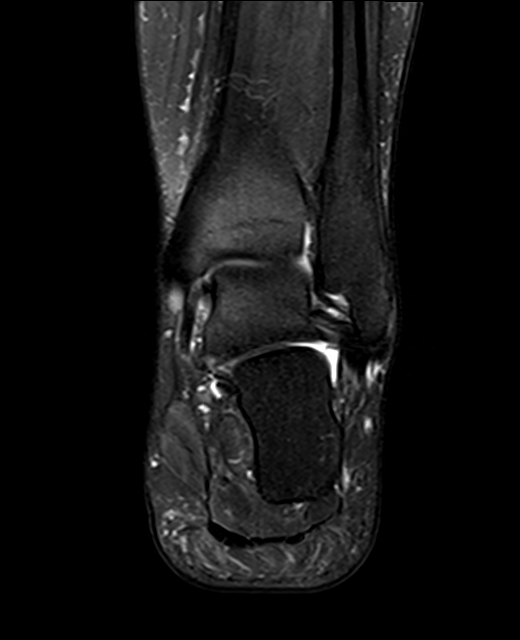
[im 33/33]
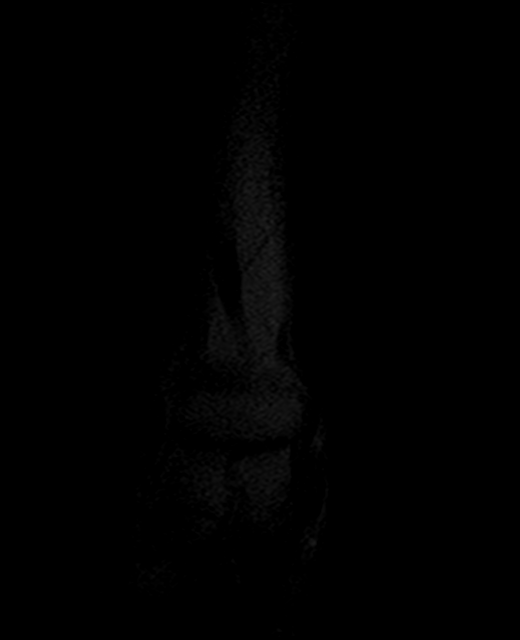

[Series 9: T2 fat-sat · axial · left · 3.0mm · 0.25mm/px · z∈[-89,+31]mm · 3 of 31 slices shown (3 of 3)]
[im 1/31]
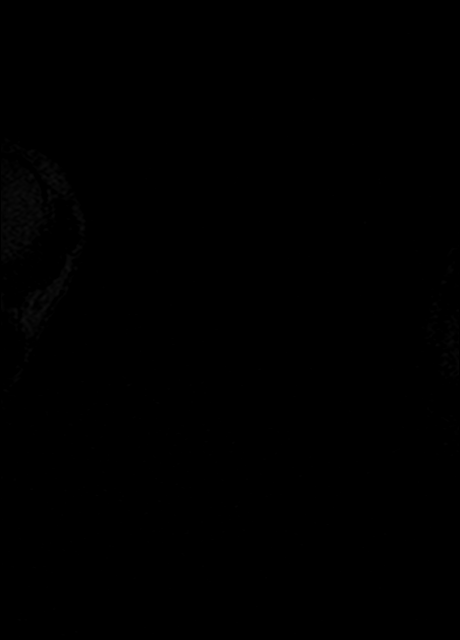
[im 16/31]
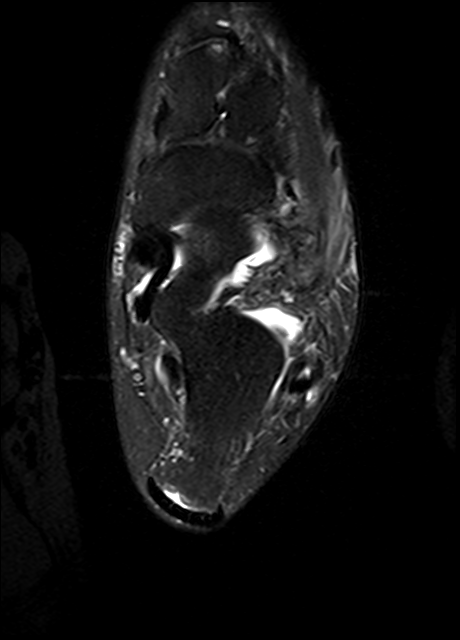
[im 31/31]
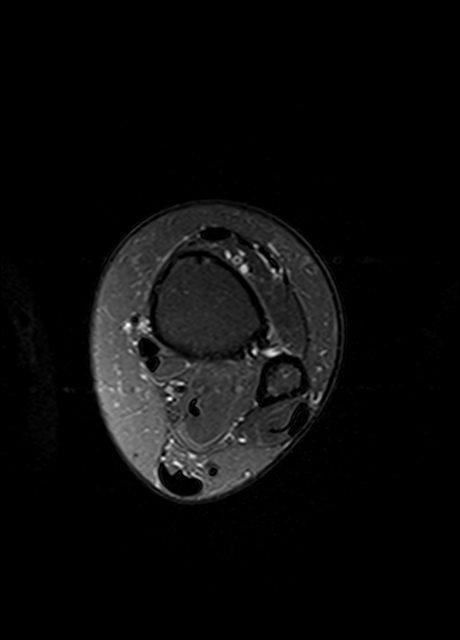

[14 of 40 positions shown; findings below may reference images not displayed]

FINDINGS: TENDONS

Peroneal: Peroneal longus tendon intact. Peroneal brevis intact.
Edema along the peroneus brevis and longus concerning for
tenosynovitis.

Posteromedial: Posterior tibial tendon intact. Flexor hallucis
longus tendon intact. Flexor digitorum longus tendon intact. Edema
along the posterior tibial tendon and flexor digitorum concerning
for tenosynovitis.

Anterior: Tibialis anterior tendon intact. Extensor hallucis longus
tendon intact Extensor digitorum longus tendon intact.

Achilles:  Intact.

Plantar Fascia: Intact.

LIGAMENTS

Lateral: Anterior talofibular ligament not visualized with
surrounding edema suggesting tear. Calcaneofibular ligament intact.
Posterior talofibular ligament intact. Anterior and posterior
tibiofibular ligaments intact.

Medial: Deltoid ligament intact. Spring ligament intact.

CARTILAGE

Ankle Joint: No joint effusion. Normal ankle mortise. No chondral
defect.

Subtalar Joints/Sinus Tarsi: Normal subtalar joints. No subtalar
joint effusion. Normal sinus tarsi.

Bones: No marrow signal abnormality.  No fracture or dislocation.

Soft Tissue: No fluid collection or hematoma. Muscles are normal
without edema or atrophy. Tarsal tunnel is normal.
IMPRESSION: 1.  No evidence of fracture or dislocation.

2. Nonvisualization of the anterior talofibular ligament with
surrounding edema concerning for full-thickness tear, it may be
subacute process.

3. Edema along the peroneal and flexor tendons concerning for
tenosynovitis, this may be reactive secondary altered mechanics due
to boot wearing.

## 2022-10-25 ENCOUNTER — Other Ambulatory Visit: Payer: Self-pay | Admitting: Obstetrics and Gynecology

## 2022-10-25 DIAGNOSIS — Z1231 Encounter for screening mammogram for malignant neoplasm of breast: Secondary | ICD-10-CM

## 2022-12-16 ENCOUNTER — Ambulatory Visit
Admission: RE | Admit: 2022-12-16 | Discharge: 2022-12-16 | Disposition: A | Discharge status: Home / Self Care | Payer: BC Managed Care – PPO | Source: Ambulatory Visit

## 2022-12-16 DIAGNOSIS — Z1231 Encounter for screening mammogram for malignant neoplasm of breast: Secondary | ICD-10-CM

## 2023-03-31 ENCOUNTER — Ambulatory Visit: Payer: BC Managed Care – PPO

## 2023-03-31 DIAGNOSIS — Z83719 Family history of colon polyps, unspecified: Secondary | ICD-10-CM

## 2023-03-31 DIAGNOSIS — K2100 Gastro-esophageal reflux disease with esophagitis, without bleeding: Secondary | ICD-10-CM

## 2023-03-31 DIAGNOSIS — Z1211 Encounter for screening for malignant neoplasm of colon: Secondary | ICD-10-CM

## 2023-03-31 DIAGNOSIS — K222 Esophageal obstruction: Secondary | ICD-10-CM

## 2024-08-02 ENCOUNTER — Other Ambulatory Visit: Payer: Self-pay | Admitting: Obstetrics and Gynecology

## 2024-08-02 DIAGNOSIS — Z1231 Encounter for screening mammogram for malignant neoplasm of breast: Secondary | ICD-10-CM

## 2024-08-23 ENCOUNTER — Ambulatory Visit
Admission: RE | Admit: 2024-08-23 | Discharge: 2024-08-23 | Disposition: A | Discharge status: Home / Self Care | Source: Ambulatory Visit | Attending: Obstetrics and Gynecology | Admitting: Obstetrics and Gynecology

## 2024-08-23 DIAGNOSIS — Z1231 Encounter for screening mammogram for malignant neoplasm of breast: Secondary | ICD-10-CM

## 2024-08-29 DIAGNOSIS — R8290 Unspecified abnormal findings in urine: Secondary | ICD-10-CM | POA: Insufficient documentation

## 2024-08-29 NOTE — Progress Notes (Signed)
   09/05/24 8:34 AM   Kayla Hanna 1971/03/11 [id number]   HPI: 53 y.o. female here for initial evaluation of abnormal UA  PCP referred for CaOX crystal noted in UA   - UA 07/19/24 - +CaOx crystal noted, >30WBC w/ >10 squam  CT A/P w/ con (Sept 2025, [facility]) - report suggests normal kidneys, no nephrolithiasis or GU abnormalities  Hx of emergent cholecystectomy in Sept 2025 [facility]) Hx of RA, GAD, HTN On methotrexate, Humira, prednisone Never smoker No history of prior GU conditions or GU surgery  Fhx of bladder cancer in father    PMH: Past Medical History:  Diagnosis Date   Anxiety    Depression    Obesity    Vitamin D  deficiency     Surgical History: Past Surgical History:  Procedure Laterality Date   HYSTEROSCOPY  2005    Family History: Family History  Problem Relation Age of Onset   Heart disease Paternal Grandmother    Diabetes Paternal Grandmother    Breast cancer Neg Hx     Social History:  reports that she has never smoked. She has never used smokeless tobacco. She reports that she does not currently use alcohol. She reports that she does not use drugs.      Physical Exam: BP 126/85   Pulse 88   Wt 145 lb (65.8 kg)   SpO2 100%   BMI 24.13 kg/m    Constitutional:  Alert and oriented, No acute distress. Cardiovascular: No clubbing, cyanosis, or edema. Respiratory: Normal respiratory effort, no increased work of breathing. GI: Nondistended Skin: No rashes, bruises or suspicious lesions. Neurologic: Grossly intact, no focal deficits, moving all 4 extremities. Psychiatric: Normal mood and affect.  Laboratory Data: UA 07/19/24 - +CaOx crystal noted, >30WBC w/ >10 squam UA (06/27/24) - LE+, WBC 121, RBC 17, >16 squam   Pertinent Imaging: CT A/P w/ con (Sept 2025, [facility]) - report suggests normal kidneys, no nephrolithiasis or GU abnormalities    Assessment & Plan:    Abnormal urinalysis Assessment & Plan: CaOx on isolated UA sample Negative  CT imaging  Likely transient CaOx crystals, secondary to dehydration or sample collection  Low stone-risk factors   - No further workup at this time - Briefly, reviewed the basic tenets of kidney stone prevention, although overall risk may not be significantly higher than general population   Acute cystitis without hematuria Assessment & Plan: Possible recent UTI in Sept 2025  - although >squam samples and no culture sent  - suspicion of UTI with indeterminate UA should warrant culture submission prior to initiation of antibiotics if feasible       Kayla Skye, MD 09/05/2024  [facility] [address] [place], [place] [address] [phone]

## 2024-08-29 NOTE — Assessment & Plan Note (Signed)
 CaOx on isolated UA sample Negative CT imaging  Likely transient CaOx crystals, secondary to dehydration or sample collection  Low stone-risk factors   - No further workup at this time - Briefly, reviewed the basic tenets of kidney stone prevention, although overall risk may not be significantly higher than general population

## 2024-09-05 ENCOUNTER — Ambulatory Visit (INDEPENDENT_AMBULATORY_CARE_PROVIDER_SITE_OTHER): Admitting: Urology

## 2024-09-05 VITALS — BP 126/85 | HR 88 | Wt 145.0 lb

## 2024-09-05 DIAGNOSIS — N3000 Acute cystitis without hematuria: Secondary | ICD-10-CM

## 2024-09-05 DIAGNOSIS — N390 Urinary tract infection, site not specified: Secondary | ICD-10-CM | POA: Insufficient documentation

## 2024-09-05 DIAGNOSIS — R8290 Unspecified abnormal findings in urine: Secondary | ICD-10-CM

## 2024-09-05 NOTE — Assessment & Plan Note (Signed)
 Possible recent UTI in Sept 2025  - although >squam samples and no culture sent  - suspicion of UTI with indeterminate UA should warrant culture submission prior to initiation of antibiotics if feasible
